# Patient Record
Sex: Male | Born: 1990 | Race: Black or African American | Hispanic: No | Marital: Married | Smoking: Current every day smoker
Health system: Southern US, Community
[De-identification: ages and names within clinical notes are randomized; demographics above are authoritative.]

## PROBLEM LIST (undated history)

## (undated) HISTORY — PX: TYMPANOSTOMY TUBE PLACEMENT: SHX32

---
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
## 2013-01-31 ENCOUNTER — Emergency Department (HOSPITAL_COMMUNITY)
Admission: EM | Admit: 2013-01-31 | Discharge: 2013-01-31 | Disposition: A | Discharge status: Home / Self Care | Payer: 59 | Attending: Emergency Medicine | Admitting: Emergency Medicine

## 2013-01-31 ENCOUNTER — Emergency Department (HOSPITAL_COMMUNITY): Payer: 59

## 2013-01-31 ENCOUNTER — Encounter (HOSPITAL_COMMUNITY): Payer: Self-pay

## 2013-01-31 DIAGNOSIS — R109 Unspecified abdominal pain: Secondary | ICD-10-CM | POA: Insufficient documentation

## 2013-01-31 DIAGNOSIS — F17200 Nicotine dependence, unspecified, uncomplicated: Secondary | ICD-10-CM | POA: Insufficient documentation

## 2013-01-31 MED ORDER — TRAMADOL HCL 50 MG PO TABS: 50.0000 mg | ORAL_TABLET | Freq: Four times a day (QID) | ORAL | Status: AC | PRN

## 2013-01-31 NOTE — ED Notes (Signed)
Lt. Lumbar pain.  Pt. Reports playing basket ball yesterday.  Denies any numbness or tingling.  Denies any urinary symptoms, bowel symptoms .

## 2013-01-31 NOTE — ED Provider Notes (Signed)
   History    CSN: [id number] Arrival date & time 01/31/13  0716  First MD Initiated Contact with Patient 01/31/13 [phone]     Chief Complaint  Patient presents with  . Flank Pain   (Consider location/radiation/quality/duration/timing/severity/associated sxs/prior Treatment) The history is provided by the patient.   Patient presents to the ED for left flank pain.  Pain described as sharp, non-radiating, and localized to left lateral ribs.  Pain worse with twisting motions, walking, and deep breathing, relieved by nothing.  Pt states he was playing basketball yesterday which he does frequently.  No injury or trauma during play.  No other overly strenuous activity.  No hx of asthma. No urinary sx or hx of kidney stones. Has taken OTC ibuprofen without relief.    History reviewed. No pertinent past medical history. History reviewed. No pertinent past surgical history. No family history on file. History  Substance Use Topics  . Smoking status: Current Every Day Smoker  . Smokeless tobacco: Not on file  . Alcohol Use: Yes    Review of Systems  Genitourinary: Positive for flank pain.  All other systems reviewed and are negative.    Allergies  Review of patient's allergies indicates no known allergies.  Home Medications  No current outpatient prescriptions on file. BP 123/73  Pulse 67  Temp(Src) 98.2 F (36.8 C)  Resp 18  SpO2 100% Physical Exam  Nursing note and vitals reviewed. Constitutional: He is oriented to person, place, and time. He appears well-developed and well-nourished.  HENT:  Head: Normocephalic and atraumatic.  Eyes: Conjunctivae and EOM are normal.  Neck: Normal range of motion. Neck supple.  Cardiovascular: Normal rate, regular rhythm and normal heart sounds.   Pulmonary/Chest: Effort normal and breath sounds normal. No respiratory distress. He has no decreased breath sounds. He has no wheezes. He has no rhonchi.    TTP of left lateral ribs, lungs CTAB    Abdominal: There is no CVA tenderness.  Musculoskeletal: Normal range of motion. He exhibits no edema.  Neurological: He is alert and oriented to person, place, and time.  Skin: Skin is warm and dry.  Psychiatric: He has a normal mood and affect.    ED Course  Procedures (including critical care time) Labs Reviewed - No data to display Dg Chest 2 View  01/31/2013   *RADIOLOGY REPORT*  Clinical Data: Lateral left chest pain.  CHEST - 2 VIEW  Comparison: None.  Findings: The heart size and pulmonary vascularity are normal and the lungs are clear.  Slight thoracolumbar scoliosis.  No acute abnormality.  IMPRESSION: No acute abnormality.   Original Report Authenticated By: Francene Boyers, M.D.   1. Left flank pain     MDM   X-ray negative for acute rib fx or pneumothorax.  Pain likely MSK in nature from physical activity.  Doubt PE or kidney stones.  Rx tramadol. FU with PCP if sx not improving.  Discussed plan with pt.   Garlon Hatchet, PA-C 01/31/13 [phone]

## 2013-02-01 NOTE — ED Provider Notes (Signed)
Medical screening examination/treatment/procedure(s) were performed by non-physician practitioner and as supervising physician I was immediately available for consultation/collaboration.  Olivia Mackie, MD 02/01/13 [phone]

## 2013-11-18 ENCOUNTER — Emergency Department (INDEPENDENT_AMBULATORY_CARE_PROVIDER_SITE_OTHER)
Admission: EM | Admit: 2013-11-18 | Discharge: 2013-11-18 | Disposition: A | Discharge status: Home / Self Care | Payer: 59 | Source: Home / Self Care | Attending: Family Medicine | Admitting: Family Medicine

## 2013-11-18 ENCOUNTER — Other Ambulatory Visit (HOSPITAL_COMMUNITY)
Admission: RE | Admit: 2013-11-18 | Discharge: 2013-11-18 | Disposition: A | Discharge status: Home / Self Care | Payer: 59 | Source: Ambulatory Visit | Attending: Family Medicine | Admitting: Family Medicine

## 2013-11-18 ENCOUNTER — Encounter (HOSPITAL_COMMUNITY): Payer: Self-pay | Admitting: Emergency Medicine

## 2013-11-18 DIAGNOSIS — Z113 Encounter for screening for infections with a predominantly sexual mode of transmission: Secondary | ICD-10-CM | POA: Insufficient documentation

## 2013-11-18 DIAGNOSIS — N342 Other urethritis: Secondary | ICD-10-CM

## 2013-11-18 DIAGNOSIS — Z202 Contact with and (suspected) exposure to infections with a predominantly sexual mode of transmission: Secondary | ICD-10-CM

## 2013-11-18 LAB — POCT URINALYSIS DIP (DEVICE)
Bilirubin Urine: NEGATIVE
Glucose, UA: NEGATIVE mg/dL
Hgb urine dipstick: NEGATIVE
KETONES UR: NEGATIVE mg/dL
LEUKOCYTES UA: NEGATIVE
NITRITE: NEGATIVE
PROTEIN: NEGATIVE mg/dL
SPECIFIC GRAVITY, URINE: 1.025 (ref 1.005–1.030)
UROBILINOGEN UA: 1 mg/dL (ref 0.0–1.0)
pH: 7 (ref 5.0–8.0)

## 2013-11-18 MED ORDER — LIDOCAINE HCL (PF) 1 % IJ SOLN
INTRAMUSCULAR | Status: AC
  Filled 2013-11-18: qty 5

## 2013-11-18 MED ORDER — AZITHROMYCIN 250 MG PO TABS
1000.0000 mg | ORAL_TABLET | Freq: Every day | ORAL | Status: DC
  Administered 2013-11-18 (×2): 1000 mg via ORAL

## 2013-11-18 MED ORDER — CEFTRIAXONE SODIUM 250 MG IJ SOLR
INTRAMUSCULAR | Status: AC
  Filled 2013-11-18: qty 250

## 2013-11-18 MED ORDER — AZITHROMYCIN 250 MG PO TABS
ORAL_TABLET | ORAL | Status: AC
  Filled 2013-11-18: qty 4

## 2013-11-18 MED ORDER — CEFTRIAXONE SODIUM 250 MG IJ SOLR
250.0000 mg | Freq: Once | INTRAMUSCULAR | Status: AC
  Administered 2013-11-18: 250 mg via INTRAMUSCULAR

## 2013-11-18 NOTE — ED Provider Notes (Signed)
Medical screening examination/treatment/procedure(s) were performed by resident physician or non-physician practitioner and as supervising physician I was immediately available for consultation/collaboration.   Kaydynce Pat DOUGLAS MD.   Jeferson Boozer D Mads Borgmeyer, MD 11/18/13 2045 

## 2013-11-18 NOTE — ED Notes (Signed)
Patient called, no response. Front desk informed to contact us if pt returns to lobby.
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
## 2013-11-18 NOTE — Discharge Instructions (Signed)
Urethritis, Adult  Urethritis is an inflammation of the tube through which urine exits your bladder (urethra).   CAUSES  Urethritis is often caused by an infection in your urethra. The infection can be viral, like herpes. The infection can also be bacterial, like gonorrhea.  RISK FACTORS  Risk factors of urethritis include:  · Having sex without using a condom.  · Having multiple sexual partners.  · Having poor hygiene.  SIGNS AND SYMPTOMS  Symptoms of urethritis are less noticeable in women than in men. These symptoms include:  · Burning feeling when you urinate (dysuria).  · Discharge from your urethra.  · Blood in your urine (hematuria).  · Urinating more than usual.  DIAGNOSIS   To confirm a diagnosis of urethritis, your health care provider will do the following:  · Ask about your sexual history.  · Perform a physical exam.  · Have you provide a sample of your urine for lab testing.  · Use a cotton swab to gently collect a sample from your urethra for lab testing.  TREATMENT   It is important to treat urethritis. Depending on the cause, untreated urethritis may lead to serious genital infections and possibly infertility. Urethritis caused by a bacterial infection is treated with antibiotics. All sexual partners must be treated.   HOME CARE INSTRUCTIONS  · Do not have sex until the test results are known and treatment is completed, even if your symptoms go away before you finish treatment.  · Finish all medicines that you are prescribed.  SEEK MEDICAL CARE IF:   · Your symptoms are not improved in 3 days.  · Your symptoms are getting worse.  · You develop abdominal pain or pelvic pain (in women).  · You develop joint pain.  SEEK IMMEDIATE MEDICAL CARE IF:   · You have a fever with a temperature of 101.8°F (38.8°C) or greater.  · You have severe pain in the belly, back, or side.  · You have repeated vomiting.  Document Released: 01/19/2001 Document Revised: 05/16/2013 Document Reviewed: 03/26/2013  [employer]®  Patient Information ©2014 [employer].

## 2013-11-19 LAB — URINE CYTOLOGY ANCILLARY ONLY
Chlamydia: POSITIVE — AB
Neisseria Gonorrhea: NEGATIVE
TRICH (WINDOWPATH): NEGATIVE

## 2013-11-19 LAB — RPR

## 2013-11-19 LAB — HIV ANTIBODY (ROUTINE TESTING W REFLEX): HIV 1&2 Ab, 4th Generation: NONREACTIVE

## 2013-11-20 NOTE — ED Notes (Signed)
4/13 GC and Trich neg., Chlamydia pos., HIV/RPR non-reactive.  Pt. adequately treated with Zithromax and Rocephin.  Needs notified. [facility] 11/20/2013

## 2013-11-21 ENCOUNTER — Telehealth (HOSPITAL_COMMUNITY): Payer: Self-pay | Admitting: *Deleted

## 2013-11-22 NOTE — ED Notes (Signed)
I called but message said "this number is unreachable."  Call 2.  [facility]HS [facility]

## 2014-09-20 ENCOUNTER — Encounter (HOSPITAL_COMMUNITY): Payer: Self-pay | Admitting: Emergency Medicine

## 2014-09-20 ENCOUNTER — Emergency Department (HOSPITAL_COMMUNITY)
Admission: EM | Admit: 2014-09-20 | Discharge: 2014-09-20 | Disposition: A | Discharge status: Home / Self Care | Payer: BLUE CROSS/BLUE SHIELD | Attending: Emergency Medicine | Admitting: Emergency Medicine

## 2014-09-20 ENCOUNTER — Emergency Department (HOSPITAL_COMMUNITY): Payer: BLUE CROSS/BLUE SHIELD

## 2014-09-20 DIAGNOSIS — Y998 Other external cause status: Secondary | ICD-10-CM | POA: Diagnosis not present

## 2014-09-20 DIAGNOSIS — S060X9A Concussion with loss of consciousness of unspecified duration, initial encounter: Secondary | ICD-10-CM | POA: Insufficient documentation

## 2014-09-20 DIAGNOSIS — S0990XA Unspecified injury of head, initial encounter: Secondary | ICD-10-CM | POA: Diagnosis present

## 2014-09-20 DIAGNOSIS — Z720 Tobacco use: Secondary | ICD-10-CM | POA: Insufficient documentation

## 2014-09-20 DIAGNOSIS — S060X1A Concussion with loss of consciousness of 30 minutes or less, initial encounter: Secondary | ICD-10-CM

## 2014-09-20 DIAGNOSIS — Y92410 Unspecified street and highway as the place of occurrence of the external cause: Secondary | ICD-10-CM | POA: Insufficient documentation

## 2014-09-20 DIAGNOSIS — Y9389 Activity, other specified: Secondary | ICD-10-CM | POA: Insufficient documentation
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
## 2015-01-20 ENCOUNTER — Emergency Department (HOSPITAL_COMMUNITY)
Admission: EM | Admit: 2015-01-20 | Discharge: 2015-01-20 | Disposition: A | Discharge status: Home / Self Care | Payer: BLUE CROSS/BLUE SHIELD | Attending: Emergency Medicine | Admitting: Emergency Medicine

## 2015-01-20 ENCOUNTER — Encounter (HOSPITAL_COMMUNITY): Payer: Self-pay | Admitting: Emergency Medicine

## 2015-01-20 DIAGNOSIS — Z720 Tobacco use: Secondary | ICD-10-CM | POA: Insufficient documentation

## 2015-01-20 DIAGNOSIS — Y9289 Other specified places as the place of occurrence of the external cause: Secondary | ICD-10-CM | POA: Diagnosis not present

## 2015-01-20 DIAGNOSIS — Y288XXA Contact with other sharp object, undetermined intent, initial encounter: Secondary | ICD-10-CM | POA: Diagnosis not present

## 2015-01-20 DIAGNOSIS — S61214A Laceration without foreign body of right ring finger without damage to nail, initial encounter: Secondary | ICD-10-CM | POA: Diagnosis not present

## 2015-01-20 DIAGNOSIS — Z23 Encounter for immunization: Secondary | ICD-10-CM | POA: Insufficient documentation

## 2015-01-20 DIAGNOSIS — Y998 Other external cause status: Secondary | ICD-10-CM | POA: Insufficient documentation

## 2015-01-20 DIAGNOSIS — Y9389 Activity, other specified: Secondary | ICD-10-CM | POA: Diagnosis not present

## 2015-01-20 DIAGNOSIS — S61411A Laceration without foreign body of right hand, initial encounter: Secondary | ICD-10-CM

## 2015-01-20 MED ORDER — TETANUS-DIPHTH-ACELL PERTUSSIS 5-2.5-18.5 LF-MCG/0.5 IM SUSP
0.5000 mL | Freq: Once | INTRAMUSCULAR | Status: AC
  Administered 2015-01-20: 0.5 mL via INTRAMUSCULAR
  Filled 2015-01-20: qty 0.5

## 2015-01-20 MED ORDER — LIDOCAINE HCL (PF) 1 % IJ SOLN: 5.0000 mL | Freq: Once | INTRAMUSCULAR | Status: DC

## 2015-01-20 MED ORDER — LIDOCAINE HCL 1 % IJ SOLN
20.0000 mL | Freq: Once | INTRAMUSCULAR | Status: AC
  Administered 2015-01-20: 20 mL
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
## 2015-01-20 NOTE — Discharge Instructions (Signed)
Sutured Wound Care °Sutures are stitches that can be used to close wounds. Wound care helps prevent pain and infection.  °HOME CARE INSTRUCTIONS  °· Rest and elevate the injured area until all the pain and swelling are gone. °· Only take over-the-counter or prescription medicines for pain, discomfort, or fever as directed by your caregiver. °· After 48 hours, gently wash the area with mild soap and water once a day, or as directed. Rinse off the soap. Pat the area dry with a clean towel. Do not rub the wound. This may cause bleeding. °· Follow your caregiver's instructions for how often to change the bandage (dressing). Stop using a dressing after 2 days or after the wound stops draining. °· If the dressing sticks, moisten it with soapy water and gently remove it. °· Apply ointment on the wound as directed. °· Avoid stretching a sutured wound. °· Drink enough fluids to keep your urine clear or pale yellow. °· Follow up with your caregiver for suture removal as directed. °· Use sunscreen on your wound for the next 3 to 6 months so the scar will not darken. °SEEK IMMEDIATE MEDICAL CARE IF:  °· Your wound becomes red, swollen, hot, or tender. °· You have increasing pain in the wound. °· You have a red streak that extends from the wound. °· There is pus coming from the wound. °· You have a fever. °· You have shaking chills. °· There is a bad smell coming from the wound. °· You have persistent bleeding from the wound. °MAKE SURE YOU:  °· Understand these instructions. °· Will watch your condition. °· Will get help right away if you are not doing well or get worse. °Document Released: 09/02/2004 Document Revised: 10/18/2011 Document Reviewed: 11/29/2010 °[employer]® Patient Information ©2015 [employer]. This information is not intended to replace advice given to you by your health care provider. Make sure you discuss any questions you have with your health care provider. ° °

## 2015-01-20 NOTE — ED Notes (Signed)
Pt has laceration to right ring finger on top of knuckle, hemastatic. Tetanus not up to date.

## 2015-01-28 ENCOUNTER — Emergency Department (HOSPITAL_COMMUNITY)
Admission: EM | Admit: 2015-01-28 | Discharge: 2015-01-28 | Disposition: A | Discharge status: Home / Self Care | Payer: BLUE CROSS/BLUE SHIELD | Attending: Emergency Medicine | Admitting: Emergency Medicine

## 2015-01-28 ENCOUNTER — Encounter (HOSPITAL_COMMUNITY): Payer: Self-pay | Admitting: Emergency Medicine

## 2015-01-28 DIAGNOSIS — Z4802 Encounter for removal of sutures: Secondary | ICD-10-CM

## 2015-01-28 DIAGNOSIS — Z720 Tobacco use: Secondary | ICD-10-CM | POA: Diagnosis not present

## 2015-01-28 DIAGNOSIS — Z4801 Encounter for change or removal of surgical wound dressing: Secondary | ICD-10-CM | POA: Insufficient documentation
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
## 2015-01-28 NOTE — Discharge Instructions (Signed)
Keep sterri strips until they fall off on their own. Keep wound clean. Wash with soap and water. Bacitracin ointment twice a day as needed. Follow up with primary care doctor.    Suture Removal, Care After Refer to this sheet in the next few weeks. These instructions provide you with information on caring for yourself after your procedure. Your health care provider may also give you more specific instructions. Your treatment has been planned according to current medical practices, but problems sometimes occur. Call your health care provider if you have any problems or questions after your procedure. WHAT TO EXPECT AFTER THE PROCEDURE After your stitches (sutures) are removed, it is typical to have the following:  Some discomfort and swelling in the wound area.  Slight redness in the area. HOME CARE INSTRUCTIONS   If you have skin adhesive strips over the wound area, do not take the strips off. They will fall off on their own in a few days. If the strips remain in place after 14 days, you may remove them.  Change any bandages (dressings) at least once a day or as directed by your health care provider. If the bandage sticks, soak it off with warm, soapy water.  Apply cream or ointment only as directed by your health care provider. If using cream or ointment, wash the area with soap and water 2 times a day to remove all the cream or ointment. Rinse off the soap and pat the area dry with a clean towel.  Keep the wound area dry and clean. If the bandage becomes wet or dirty, or if it develops a bad smell, change it as soon as possible.  Continue to protect the wound from injury.  Use sunscreen when out in the sun. New scars become sunburned easily. SEEK MEDICAL CARE IF:  You have increasing redness, swelling, or pain in the wound.  You see pus coming from the wound.  You have a fever.  You notice a bad smell coming from the wound or dressing.  Your wound breaks open (edges not staying  together). Document Released: 04/20/2001 Document Revised: 05/16/2013 Document Reviewed: 03/07/2013 [facility] Patient Information 2015 [place], [place]. This information is not intended to replace advice given to you by your health care provider. Make sure you discuss any questions you have with your health care provider.

## 2015-01-28 NOTE — ED Notes (Signed)
Suture line on r/hand ,4th knuckle is intact. 2 sutures noted
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
## 2017-09-13 ENCOUNTER — Emergency Department (HOSPITAL_BASED_OUTPATIENT_CLINIC_OR_DEPARTMENT_OTHER)
Admission: EM | Admit: 2017-09-13 | Discharge: 2017-09-13 | Disposition: A | Discharge status: Home / Self Care | Payer: BLUE CROSS/BLUE SHIELD | Attending: Emergency Medicine | Admitting: Emergency Medicine

## 2017-09-13 ENCOUNTER — Other Ambulatory Visit: Payer: Self-pay

## 2017-09-13 ENCOUNTER — Encounter (HOSPITAL_BASED_OUTPATIENT_CLINIC_OR_DEPARTMENT_OTHER): Payer: Self-pay | Admitting: *Deleted

## 2017-09-13 DIAGNOSIS — Z79899 Other long term (current) drug therapy: Secondary | ICD-10-CM | POA: Diagnosis not present

## 2017-09-13 DIAGNOSIS — Z202 Contact with and (suspected) exposure to infections with a predominantly sexual mode of transmission: Secondary | ICD-10-CM | POA: Insufficient documentation

## 2017-09-13 DIAGNOSIS — F17210 Nicotine dependence, cigarettes, uncomplicated: Secondary | ICD-10-CM | POA: Diagnosis not present

## 2017-09-13 LAB — URINALYSIS, ROUTINE W REFLEX MICROSCOPIC
BILIRUBIN URINE: NEGATIVE
GLUCOSE, UA: NEGATIVE mg/dL
HGB URINE DIPSTICK: NEGATIVE
Ketones, ur: NEGATIVE mg/dL
Leukocytes, UA: NEGATIVE
Nitrite: NEGATIVE
Protein, ur: NEGATIVE mg/dL
SPECIFIC GRAVITY, URINE: 1.02 (ref 1.005–1.030)
pH: 6 (ref 5.0–8.0)

## 2017-09-13 MED ORDER — CEFTRIAXONE SODIUM 250 MG IJ SOLR
250.0000 mg | Freq: Once | INTRAMUSCULAR | Status: AC
  Administered 2017-09-13: 250 mg via INTRAMUSCULAR
  Filled 2017-09-13: qty 250

## 2017-09-13 MED ORDER — LIDOCAINE HCL (PF) 1 % IJ SOLN
INTRAMUSCULAR | Status: AC
  Administered 2017-09-13: 1.2 mL
  Filled 2017-09-13: qty 5

## 2017-09-13 MED ORDER — AZITHROMYCIN 250 MG PO TABS
1000.0000 mg | ORAL_TABLET | Freq: Once | ORAL | Status: AC
  Administered 2017-09-13: 1000 mg via ORAL
  Filled 2017-09-13: qty 4
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
## 2017-09-13 NOTE — ED Triage Notes (Signed)
Pt states girlfriend with chlamydia denies discharge

## 2017-09-13 NOTE — ED Notes (Signed)
ED Provider at bedside. 
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
## 2017-09-14 LAB — GC/CHLAMYDIA PROBE AMP (~~LOC~~) NOT AT ARMC
CHLAMYDIA, DNA PROBE: POSITIVE — AB
NEISSERIA GONORRHEA: NEGATIVE
